# Patient Record
Sex: Female | Born: 1980 | Race: White | Hispanic: No | Marital: Married | Smoking: Former smoker
Health system: Southern US, Community
[De-identification: ages and names within clinical notes are randomized; demographics above are authoritative.]

## PROBLEM LIST (undated history)

## (undated) DIAGNOSIS — E282 Polycystic ovarian syndrome: Secondary | ICD-10-CM

## (undated) DIAGNOSIS — F419 Anxiety disorder, unspecified: Secondary | ICD-10-CM

## (undated) DIAGNOSIS — G43909 Migraine, unspecified, not intractable, without status migrainosus: Secondary | ICD-10-CM

## (undated) DIAGNOSIS — R011 Cardiac murmur, unspecified: Secondary | ICD-10-CM

## (undated) DIAGNOSIS — N200 Calculus of kidney: Secondary | ICD-10-CM

## (undated) DIAGNOSIS — E119 Type 2 diabetes mellitus without complications: Secondary | ICD-10-CM

## (undated) DIAGNOSIS — N83209 Unspecified ovarian cyst, unspecified side: Secondary | ICD-10-CM

## (undated) HISTORY — DX: Anxiety disorder, unspecified: F41.9

## (undated) HISTORY — DX: Type 2 diabetes mellitus without complications: E11.9

## (undated) HISTORY — DX: Polycystic ovarian syndrome: E28.2

## (undated) HISTORY — DX: Unspecified ovarian cyst, unspecified side: N83.209

## (undated) HISTORY — DX: Calculus of kidney: N20.0

## (undated) HISTORY — DX: Cardiac murmur, unspecified: R01.1

---

## 2001-12-25 ENCOUNTER — Other Ambulatory Visit: Admission: RE | Admit: 2001-12-25 | Discharge: 2001-12-25 | Payer: Self-pay | Admitting: Obstetrics and Gynecology

## 2003-01-18 ENCOUNTER — Other Ambulatory Visit: Admission: RE | Admit: 2003-01-18 | Discharge: 2003-01-18 | Payer: Self-pay | Admitting: *Deleted

## 2004-07-24 ENCOUNTER — Other Ambulatory Visit: Admission: RE | Admit: 2004-07-24 | Discharge: 2004-07-24 | Payer: Self-pay | Admitting: Obstetrics & Gynecology

## 2004-11-01 ENCOUNTER — Observation Stay (HOSPITAL_COMMUNITY): Admission: AD | Admit: 2004-11-01 | Discharge: 2004-11-01 | Payer: Self-pay | Admitting: Obstetrics and Gynecology

## 2004-11-01 ENCOUNTER — Encounter: Payer: Self-pay | Admitting: Emergency Medicine

## 2004-11-03 ENCOUNTER — Observation Stay (HOSPITAL_COMMUNITY): Admission: AD | Admit: 2004-11-03 | Discharge: 2004-11-04 | Payer: Self-pay | Admitting: Obstetrics & Gynecology

## 2008-01-29 ENCOUNTER — Emergency Department (HOSPITAL_COMMUNITY): Admission: EM | Admit: 2008-01-29 | Discharge: 2008-01-29 | Payer: Self-pay | Admitting: Emergency Medicine

## 2008-06-07 HISTORY — PX: ARTERY REPAIR: SHX559

## 2008-06-07 HISTORY — PX: GALLBLADDER SURGERY: SHX652

## 2008-07-17 ENCOUNTER — Inpatient Hospital Stay (HOSPITAL_COMMUNITY): Admission: AD | Admit: 2008-07-17 | Discharge: 2008-07-17 | Payer: Self-pay | Admitting: Obstetrics and Gynecology

## 2008-09-01 ENCOUNTER — Emergency Department (HOSPITAL_COMMUNITY): Admission: EM | Admit: 2008-09-01 | Discharge: 2008-09-01 | Payer: Self-pay | Admitting: *Deleted

## 2008-09-09 ENCOUNTER — Ambulatory Visit (HOSPITAL_COMMUNITY): Admission: RE | Admit: 2008-09-09 | Discharge: 2008-09-09 | Payer: Self-pay | Admitting: Legal Medicine

## 2008-09-27 ENCOUNTER — Encounter: Admission: RE | Admit: 2008-09-27 | Discharge: 2008-09-27 | Payer: Self-pay | Admitting: Gastroenterology

## 2008-11-20 ENCOUNTER — Encounter (INDEPENDENT_AMBULATORY_CARE_PROVIDER_SITE_OTHER): Payer: Self-pay | Admitting: Surgery

## 2008-11-20 ENCOUNTER — Ambulatory Visit (HOSPITAL_COMMUNITY): Admission: RE | Admit: 2008-11-20 | Discharge: 2008-11-20 | Payer: Self-pay | Admitting: Surgery

## 2010-01-25 ENCOUNTER — Emergency Department (HOSPITAL_COMMUNITY): Admission: EM | Admit: 2010-01-25 | Discharge: 2010-01-25 | Payer: Self-pay | Admitting: Emergency Medicine

## 2010-02-15 IMAGING — NM NM HEPATO W/GB/PHARM/[PERSON_NAME]
2 series · 12 of 12 positions shown · non-contrast
Comparison: None

CLINICAL DATA: Right upper quadrant pain, negative ultrasound

NUCLEAR MEDICINE HEPATOBILIARY IMAGING WITH GALLBLADDER EF
TECHNIQUE: Sequential images of the abdomen were obtained [DATE]
minutes following intravenous administration of
radiopharmaceutical.  After slow intravenous infusion of 1.3 uCg
Cholecystokinin, gallbladder ejection fraction was determined.
Radiopharmaceutical:  5.4 mCi Qc-99m Choletec

[hepato · 2.40mm/px · 6 of 30 frames shown (1 of 2)]
[frame 3/30]
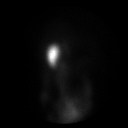
[frame 8/30]
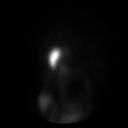
[frame 13/30]
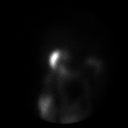
[frame 18/30]
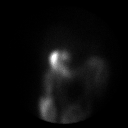
[frame 23/30]
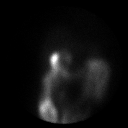
[frame 28/30]
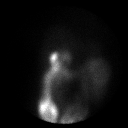

[hepato · 2.40mm/px · 6 of 60 frames shown (2 of 2)]
[frame 6/60]
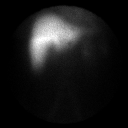
[frame 16/60]
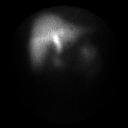
[frame 26/60]
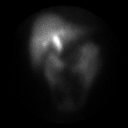
[frame 36/60]
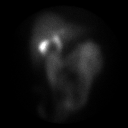
[frame 46/60]
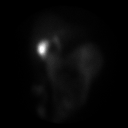
[frame 56/60]
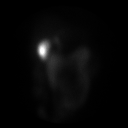

[12 of 12 positions shown; findings below may reference images not displayed]

FINDINGS: Normal tracer extraction from bloodstream, indicating normal
hepatocellular function.
Prompt excretion of tracer into biliary tree.
Small bowel visualized by 17 minutes.
Gallbladder visualized at 27 minutes.
No hepatic retention of tracer.

Following CCK administration, normal emptying of tracer from
gallbladder is visualized.
Calculated gallbladder ejection fraction is 78%, normal.
Patient experienced right upper quadrant pain with infusion of CCK.
IMPRESSION: Patent biliary tree.
Normal gallbladder ejection fraction of 78%.
Patient experienced right upper quadrant pain with CCK
administration.

## 2010-06-28 ENCOUNTER — Encounter: Payer: Self-pay | Admitting: Legal Medicine

## 2010-08-21 LAB — COMPREHENSIVE METABOLIC PANEL
ALT: 26 U/L (ref 0–35)
AST: 23 U/L (ref 0–37)
CO2: 22 mEq/L (ref 19–32)
Calcium: 9.4 mg/dL (ref 8.4–10.5)
GFR calc Af Amer: >60 mL/min (ref >60)
Sodium: 138 mEq/L (ref 135–145)
Total Protein: 7.4 g/dL (ref 6.0–8.3)

## 2010-08-21 LAB — DIFFERENTIAL
Eosinophils Absolute: 0.1 10*3/uL (ref 0.0–0.7)
Eosinophils Relative: 1 % (ref 0–5)
Lymphs Abs: 2.9 10*3/uL (ref 0.7–4.0)
Monocytes Relative: 6 % (ref 3–12)

## 2010-08-21 LAB — CBC
Hemoglobin: 14.9 g/dL (ref 12.0–15.0)
MCHC: 35.5 g/dL (ref 30.0–36.0)
RDW: 12.5 % (ref 11.5–15.5)
WBC: 10.4 10*3/uL (ref 4.0–10.5)

## 2010-08-21 LAB — URINALYSIS, ROUTINE W REFLEX MICROSCOPIC
Glucose, UA: NEGATIVE mg/dL
Ketones, ur: NEGATIVE mg/dL
Protein, ur: NEGATIVE mg/dL

## 2010-09-14 LAB — URINE MICROSCOPIC-ADD ON

## 2010-09-14 LAB — DIFFERENTIAL
Basophils Absolute: 0.1 10*3/uL (ref 0.0–0.1)
Basophils Relative: 1 % (ref 0–1)
Eosinophils Absolute: 0 10*3/uL (ref 0.0–0.7)
Monocytes Absolute: 0.4 10*3/uL (ref 0.1–1.0)
Neutro Abs: 6 10*3/uL (ref 1.7–7.7)

## 2010-09-14 LAB — COMPREHENSIVE METABOLIC PANEL
Albumin: 4.7 g/dL (ref 3.5–5.2)
Alkaline Phosphatase: 73 U/L (ref 39–117)
BUN: 7 mg/dL (ref 6–23)
Chloride: 104 mEq/L (ref 96–112)
Glucose, Bld: 93 mg/dL (ref 70–99)
Potassium: 3.9 mEq/L (ref 3.5–5.1)
Total Bilirubin: 1.8 mg/dL — ABNORMAL HIGH (ref 0.3–1.2)

## 2010-09-14 LAB — URINALYSIS, ROUTINE W REFLEX MICROSCOPIC
Bilirubin Urine: NEGATIVE
Ketones, ur: NEGATIVE mg/dL
Protein, ur: NEGATIVE mg/dL
Urobilinogen, UA: 0.2 mg/dL (ref 0.0–1.0)

## 2010-09-14 LAB — CBC
HCT: 45 % (ref 36.0–46.0)
Hemoglobin: 15.7 g/dL — ABNORMAL HIGH (ref 12.0–15.0)
Platelets: 290 10*3/uL (ref 150–400)
WBC: 8.7 10*3/uL (ref 4.0–10.5)

## 2010-09-14 LAB — GLUCOSE, CAPILLARY: Glucose-Capillary: 88 mg/dL (ref 70–99)

## 2010-09-17 LAB — URINALYSIS, ROUTINE W REFLEX MICROSCOPIC
Bilirubin Urine: NEGATIVE
Ketones, ur: NEGATIVE mg/dL
Protein, ur: NEGATIVE mg/dL
Urobilinogen, UA: 0.2 mg/dL (ref 0.0–1.0)

## 2010-09-17 LAB — CBC
HCT: 44.1 % (ref 36.0–46.0)
Hemoglobin: 15.4 g/dL — ABNORMAL HIGH (ref 12.0–15.0)
Platelets: 287 10*3/uL (ref 150–400)
RBC: 4.75 MIL/uL (ref 3.87–5.11)
WBC: 10.9 10*3/uL — ABNORMAL HIGH (ref 4.0–10.5)

## 2010-09-17 LAB — COMPREHENSIVE METABOLIC PANEL
Albumin: 4.7 g/dL (ref 3.5–5.2)
Alkaline Phosphatase: 70 U/L (ref 39–117)
BUN: 14 mg/dL (ref 6–23)
CO2: 26 mEq/L (ref 19–32)
Chloride: 102 mEq/L (ref 96–112)
Potassium: 4 mEq/L (ref 3.5–5.1)
Total Bilirubin: 1.4 mg/dL — ABNORMAL HIGH (ref 0.3–1.2)

## 2010-09-17 LAB — DIFFERENTIAL
Basophils Absolute: 0.1 10*3/uL (ref 0.0–0.1)
Basophils Relative: 1 % (ref 0–1)
Eosinophils Relative: 1 % (ref 0–5)
Monocytes Absolute: 0.6 10*3/uL (ref 0.1–1.0)
Neutro Abs: 7.7 10*3/uL (ref 1.7–7.7)

## 2010-09-17 LAB — POCT PREGNANCY, URINE: Preg Test, Ur: NEGATIVE

## 2010-09-17 LAB — URINE MICROSCOPIC-ADD ON

## 2010-09-17 LAB — POCT CARDIAC MARKERS: Troponin i, poc: <0.05 ng/mL (ref 0.00–0.09)

## 2010-10-20 NOTE — Op Note (Signed)
Amber, Wise                   ACCOUNT NO.:  [id number]   MEDICAL RECORD NO.:  [id number]          PATIENT TYPE:  AMB   LOCATION:  SDS                          FACILITY:  [facility]   PHYSICIAN:  Currie Paris, M.D.DATE OF BIRTH:  02-Jun-1981   DATE OF PROCEDURE:  11/20/2008  DATE OF DISCHARGE:  11/20/2008                               OPERATIVE REPORT   PREOPERATIVE DIAGNOSIS:  Biliary dyskinesia.   POSTOPERATIVE DIAGNOSIS:  Biliary dyskinesia.   OPERATION:  Laparoscopic cholecystectomy with operative cholangiogram.   SURGEON:  Currie Paris, MD   ANESTHESIA:  General.   CLINICAL HISTORY:  This is a 30 year old woman who has been having  intermittent biliary type symptoms with a negative workup with the  exception of production of her symptoms on administration of CCK for a  HIDA scan.  After discussion with the patient, she elected to proceed to  cholecystectomy.   DESCRIPTION OF PROCEDURE:  I saw the patient in the holding area.  She  had no further questions.  We confirmed the procedure as noted above.   The patient was taken to the operating room and after satisfactory  general endotracheal anesthesia had been obtained, the abdomen was  prepped and draped.  The time-out was done.   I used 0.25% plain Marcaine for each incision.  A local incision was  made in the inferior margin of the umbilicus.  The fascia identified and  opened and the peritoneal cavity entered.  Hasson was placed and the  abdomen was insufflated to 15.   The camera showed no gross abnormalities in the abdominal cavity.  The  patient was then placed in reverse Trendelenburg and tilted left.  A 10-  11 trocar was placed in the epigastrium and two 5s laterally.  The  gallbladder looked normal.  The peritoneum over the area of cystic duct  was opened and I made a nice window and could see the posterior and  anterior branch of the cystic artery as well as a nice segment of cystic  duct.  I put  clips on the cystic artery and then one at the cystic duct  at the junction with the gallbladder.   The cystic duct was opened and a Cook catheter threaded.  The cystic  duct was extremely tiny but we were able to get a taut catheter in.   Operative cholangiogram showed prompt filling of the duodenum with no  delay.  We had to inject a little bit extra to get the hepatic radicals  to fill as it went antegrade so well but I thought the anatomy was  normal.   The catheter was removed and two clips were placed on the stay side of  the cystic duct and it was divided.  Additional clips were placed on the  artery to be sure we had two clips on the stay side and both branches  were divided.   The gallbladder was removed from below to above with coagulation current  of the cautery.  It was then brought out of the umbilical site.   I  reinsufflated to make sure there was no evidence of bleeding or bile  leaks and everything appeared okay.  The lateral trocars were removed  under direct vision.  The umbilical site was closed with a purse-string.  The abdomen was deflated through the epigastric port which was closed.  All skin incisions were closed with 4-0 Monocryl subcuticular and  Dermabond.   The patient tolerated the procedure well and there were no  complications.      Currie Paris, M.D.  Electronically Signed     CJS/MEDQ  D:  11/20/2008  T:  11/21/2008  Job:  [id number]   cc:   Bernette Redbird, M.D.

## 2010-10-23 NOTE — Discharge Summary (Signed)
[name], Amber Wise                   ACCOUNT NO.:  [id number]   MEDICAL RECORD NO.:  [id number]          PATIENT TYPE:  INP   LOCATION:  9307                          FACILITY:  [facility]   PHYSICIAN:  Freddy Finner, M.D.   DATE OF BIRTH:  1981/04/02   DATE OF ADMISSION:  11/03/2004  DATE OF DISCHARGE:                                 DISCHARGE SUMMARY   DISCHARGE DIAGNOSES:  1.  Ovarian cyst with rupture.  2.  Hemoperitoneum.  3.  Fever, probably secondary to hemoperitoneum.   OPERATIVE PROCEDURES:  None.   DISPOSITION:  The patient is in satisfactory, improved condition on the  morning after her admission. She is afebrile now. She is discharged home  with light activity, regular diet, Augmentin 875 mg b.i.d. for 5 days,  follow up in the office in 2 days. She is to report recurrent fever above  100 or severe abdominal pain.   Details of the present illness and physical exam are recorded in the  admission note. Briefly, the patient had an ovarian cyst with hemorrhage  into the peritoneal cavity on Oct 31, 2004. She was evaluated at [facility].  Subsequently she was transported to this hospital where she was observed for  24 hours. Following discharge, she had temperature spike to 101 and was  readmitted yesterday for that reason.   Laboratory data during this admission includes a normal urinalysis.  Admission CBC with hemoglobin of 10, WBC of 8.6. Follow-up hemoglobin was  10.3 on the morning after admission, white count of 5.1.   HOSPITAL COURSE:  The patient was admitted for further management due to the  temperature elevation. She was started on Unasyn 3 g IV q.6h. She was  followed through the night. By the morning after her admission, her  condition was satisfactory. She was discharged home with disposition as  noted above.      WRN/MEDQ  D:  11/04/2004  T:  11/04/2004  Job:  [id number]

## 2010-10-23 NOTE — H&P (Signed)
Amber Wise, Amber Wise                   ACCOUNT NO.:  [id number]   MEDICAL RECORD NO.:  [id number]          PATIENT TYPE:  INP   LOCATION:  9307                          FACILITY:  [facility]   PHYSICIAN:  Freddy Finner, M.D.   DATE OF BIRTH:  [birth year]   DATE OF ADMISSION:  11/03/2004  DATE OF DISCHARGE:                                HISTORY & PHYSICAL   ADMITTING DIAGNOSIS:  Ruptured hemorrhage ovarian cyst.  Previously-  documented hemoperitoneum, now with onset of fever.   HISTORY OF PRESENT ILLNESS:  The patient is a 30 year old who first  presented to the [facility] emergency room on the evening of Oct 31, 2004, 3 days  prior to admission, following the onset of abdominal pain following an  episode of intercourse.  CT scan of the abdomen and pelvis at that hospital  revealed fluid within the peritoneal cavity consistent with blood, presumed  from a ruptured ovarian cyst.  She was transferred to this hospital and  observed for 23 hours by Dr. Vincente Poli.  Clinically, she was stable over that  interval of time and in adequate condition for discharge.  She was  discharged home on Nov 01, 2004.  She presents today with a feeling of  malaise on the morning of presentation and progressive increasing  temperature to the level of 101 on presentation here to the emergency room.  Because of the history and fever, she is now admitted.   CURRENT REVIEW OF SYSTEMS:  Otherwise negative.  She had an adequate  appetite.  She denies nausea, vomiting, diarrhea, urinary tract symptoms.   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  She also has a history of  nephrolithiasis.  There are no other known significant medical illnesses.   ALLERGIES:  There are no known allergies to medications.   She has never had a blood transfusion.   MEDICATIONS:  Her only medication at the present time on admission was  Percocet, which she was taking for  abdominal pain.   PHYSICAL EXAMINATION:  VITAL SIGNS:  Temperature was 101, documented in the  triage area on admission.  NECK:  Thyroid gland is not palpably enlarged.  CHEST:  Clear to auscultation throughout.  HEART:  Sinus tachycardia with grade 2/6 to 3/6 systolic murmur heard at the  left second intercostal space.  No other audible murmurs, rubs, or gallops  were noted.  ABDOMEN:  Soft.  There is mild generalized tenderness.  There is no CVA  tenderness.  PELVIC:  Not performed tonight.  EXTREMITIES:  Without clubbing, cyanosis, or edema.   LABORATORY DATA:  WBC of 8.6 on admission, hemoglobin of 10.   ASSESSMENT:  Ruptured ovarian cyst with hemoperitoneum documented previously  on Oct 31, 2004 and Nov 01, 2004, now with onset of fever, possibly  secondary to hemoperitoneum versus some type of infection complication  associated with this.   PLAN:  The patient is admitted now for IV fluid hydration, for IV  antibiotics, and for close observation.  WRN/MEDQ  D:  11/03/2004  T:  11/03/2004  Job:  [id number]

## 2010-12-05 ENCOUNTER — Emergency Department (HOSPITAL_COMMUNITY)
Admission: EM | Admit: 2010-12-05 | Discharge: 2010-12-05 | Disposition: A | Discharge status: Home / Self Care | Payer: BC Managed Care – PPO | Attending: Emergency Medicine | Admitting: Emergency Medicine

## 2010-12-05 ENCOUNTER — Emergency Department (HOSPITAL_COMMUNITY): Payer: BC Managed Care – PPO

## 2010-12-05 DIAGNOSIS — R109 Unspecified abdominal pain: Secondary | ICD-10-CM | POA: Insufficient documentation

## 2010-12-05 DIAGNOSIS — Z9089 Acquired absence of other organs: Secondary | ICD-10-CM | POA: Insufficient documentation

## 2010-12-05 LAB — URINE MICROSCOPIC-ADD ON

## 2010-12-05 LAB — POCT I-STAT, CHEM 8
Creatinine, Ser: 0.8 mg/dL (ref 0.50–1.10)
Glucose, Bld: 100 mg/dL — ABNORMAL HIGH (ref 70–99)
Hemoglobin: 15 g/dL (ref 12.0–15.0)
TCO2: 23 mmol/L (ref 0–100)

## 2010-12-05 LAB — URINALYSIS, ROUTINE W REFLEX MICROSCOPIC
Nitrite: NEGATIVE
Specific Gravity, Urine: 1.022 (ref 1.005–1.030)
Urobilinogen, UA: 0.2 mg/dL (ref 0.0–1.0)

## 2010-12-05 LAB — PREGNANCY, URINE: Preg Test, Ur: NEGATIVE

## 2010-12-23 ENCOUNTER — Inpatient Hospital Stay (INDEPENDENT_AMBULATORY_CARE_PROVIDER_SITE_OTHER)
Admission: RE | Admit: 2010-12-23 | Discharge: 2010-12-23 | Disposition: A | Discharge status: Home / Self Care | Payer: BC Managed Care – PPO | Source: Ambulatory Visit | Attending: Emergency Medicine | Admitting: Emergency Medicine

## 2010-12-23 DIAGNOSIS — IMO0001 Reserved for inherently not codable concepts without codable children: Secondary | ICD-10-CM

## 2011-06-14 ENCOUNTER — Ambulatory Visit (INDEPENDENT_AMBULATORY_CARE_PROVIDER_SITE_OTHER): Payer: BC Managed Care – PPO

## 2011-06-14 DIAGNOSIS — R05 Cough: Secondary | ICD-10-CM

## 2011-06-14 DIAGNOSIS — R071 Chest pain on breathing: Secondary | ICD-10-CM

## 2011-06-14 DIAGNOSIS — R059 Cough, unspecified: Secondary | ICD-10-CM

## 2012-12-04 ENCOUNTER — Emergency Department (HOSPITAL_COMMUNITY)
Admission: EM | Admit: 2012-12-04 | Discharge: 2012-12-04 | Disposition: A | Discharge status: Home / Self Care | Payer: BC Managed Care – PPO | Attending: Emergency Medicine | Admitting: Emergency Medicine

## 2012-12-04 ENCOUNTER — Emergency Department (HOSPITAL_COMMUNITY): Payer: BC Managed Care – PPO

## 2012-12-04 DIAGNOSIS — R29810 Facial weakness: Secondary | ICD-10-CM | POA: Insufficient documentation

## 2012-12-04 DIAGNOSIS — Z79899 Other long term (current) drug therapy: Secondary | ICD-10-CM | POA: Insufficient documentation

## 2012-12-04 DIAGNOSIS — R739 Hyperglycemia, unspecified: Secondary | ICD-10-CM

## 2012-12-04 DIAGNOSIS — R000 Tachycardia, unspecified: Secondary | ICD-10-CM | POA: Insufficient documentation

## 2012-12-04 DIAGNOSIS — R7309 Other abnormal glucose: Secondary | ICD-10-CM | POA: Insufficient documentation

## 2012-12-04 DIAGNOSIS — Z3202 Encounter for pregnancy test, result negative: Secondary | ICD-10-CM | POA: Insufficient documentation

## 2012-12-04 DIAGNOSIS — G43809 Other migraine, not intractable, without status migrainosus: Secondary | ICD-10-CM

## 2012-12-04 DIAGNOSIS — Z881 Allergy status to other antibiotic agents status: Secondary | ICD-10-CM | POA: Insufficient documentation

## 2012-12-04 DIAGNOSIS — R471 Dysarthria and anarthria: Secondary | ICD-10-CM | POA: Insufficient documentation

## 2012-12-04 DIAGNOSIS — Z7982 Long term (current) use of aspirin: Secondary | ICD-10-CM | POA: Insufficient documentation

## 2012-12-04 DIAGNOSIS — R209 Unspecified disturbances of skin sensation: Secondary | ICD-10-CM | POA: Insufficient documentation

## 2012-12-04 DIAGNOSIS — F419 Anxiety disorder, unspecified: Secondary | ICD-10-CM

## 2012-12-04 DIAGNOSIS — R2990 Unspecified symptoms and signs involving the nervous system: Secondary | ICD-10-CM

## 2012-12-04 DIAGNOSIS — Z888 Allergy status to other drugs, medicaments and biological substances status: Secondary | ICD-10-CM | POA: Insufficient documentation

## 2012-12-04 DIAGNOSIS — R29818 Other symptoms and signs involving the nervous system: Secondary | ICD-10-CM

## 2012-12-04 DIAGNOSIS — M6281 Muscle weakness (generalized): Secondary | ICD-10-CM | POA: Insufficient documentation

## 2012-12-04 DIAGNOSIS — F411 Generalized anxiety disorder: Secondary | ICD-10-CM | POA: Insufficient documentation

## 2012-12-04 DIAGNOSIS — Z8679 Personal history of other diseases of the circulatory system: Secondary | ICD-10-CM | POA: Insufficient documentation

## 2012-12-04 LAB — URINALYSIS, ROUTINE W REFLEX MICROSCOPIC
Leukocytes, UA: NEGATIVE
Nitrite: NEGATIVE
Specific Gravity, Urine: 1.027 (ref 1.005–1.030)
Urobilinogen, UA: 0.2 mg/dL (ref 0.0–1.0)
pH: 5 (ref 5.0–8.0)

## 2012-12-04 LAB — PROTIME-INR: Prothrombin Time: 12.5 seconds (ref 11.6–15.2)

## 2012-12-04 LAB — POCT I-STAT, CHEM 8
BUN: 7 mg/dL (ref 6–23)
Calcium, Ion: 1.13 mmol/L (ref 1.12–1.23)
Chloride: 103 mEq/L (ref 96–112)
Creatinine, Ser: 0.9 mg/dL (ref 0.50–1.10)
Glucose, Bld: 113 mg/dL — ABNORMAL HIGH (ref 70–99)
HCT: 43 % (ref 36.0–46.0)
Potassium: 3.3 mEq/L — ABNORMAL LOW (ref 3.5–5.1)

## 2012-12-04 LAB — CBC
HCT: 41.4 % (ref 36.0–46.0)
MCV: 88.5 fL (ref 78.0–100.0)
Platelets: 301 10*3/uL (ref 150–400)
RBC: 4.68 MIL/uL (ref 3.87–5.11)
WBC: 9.9 10*3/uL (ref 4.0–10.5)

## 2012-12-04 LAB — DIFFERENTIAL
Eosinophils Relative: 0 % (ref 0–5)
Lymphocytes Relative: 36 % (ref 12–46)
Lymphs Abs: 3.5 10*3/uL (ref 0.7–4.0)

## 2012-12-04 LAB — URINE MICROSCOPIC-ADD ON

## 2012-12-04 LAB — POCT PREGNANCY, URINE: Preg Test, Ur: NEGATIVE

## 2012-12-04 LAB — COMPREHENSIVE METABOLIC PANEL
ALT: 24 U/L (ref 0–35)
Alkaline Phosphatase: 79 U/L (ref 39–117)
CO2: 23 mEq/L (ref 19–32)
Calcium: 9.2 mg/dL (ref 8.4–10.5)
Chloride: 100 mEq/L (ref 96–112)
GFR calc Af Amer: >90 mL/min (ref >90)
GFR calc non Af Amer: 90 mL/min — ABNORMAL LOW (ref >90)
Glucose, Bld: 115 mg/dL — ABNORMAL HIGH (ref 70–99)
Sodium: 139 mEq/L (ref 135–145)
Total Bilirubin: 1.2 mg/dL (ref 0.3–1.2)

## 2012-12-04 LAB — RAPID URINE DRUG SCREEN, HOSP PERFORMED
Barbiturates: NOT DETECTED
Cocaine: NOT DETECTED

## 2012-12-04 LAB — GLUCOSE, CAPILLARY

## 2012-12-04 MED ORDER — LORAZEPAM 2 MG/ML IJ SOLN
1.0000 mg | Freq: Once | INTRAMUSCULAR | Status: DC
  Filled 2012-12-04: qty 1

## 2012-12-04 MED ORDER — LORAZEPAM 2 MG/ML IJ SOLN
1.0000 mg | Freq: Once | INTRAMUSCULAR | Status: AC
  Administered 2012-12-04: 1 mg via INTRAVENOUS

## 2012-12-04 MED ORDER — KETOROLAC TROMETHAMINE 30 MG/ML IJ SOLN
30.0000 mg | Freq: Once | INTRAMUSCULAR | Status: AC
  Administered 2012-12-04: 30 mg via INTRAVENOUS
  Filled 2012-12-04: qty 1

## 2012-12-04 MED ORDER — IBUPROFEN 600 MG PO TABS: 600.0000 mg | ORAL_TABLET | Freq: Four times a day (QID) | ORAL | Status: DC | PRN

## 2012-12-04 MED ORDER — LORAZEPAM 1 MG PO TABS: 0.5000 mg | ORAL_TABLET | Freq: Three times a day (TID) | ORAL | Status: DC | PRN

## 2012-12-04 NOTE — ED Notes (Signed)
To MRI via stretcher  

## 2012-12-04 NOTE — ED Notes (Signed)
States has a frontal headache starting -- had a headache a few days ago and took Execedrin Headache

## 2012-12-04 NOTE — ED Provider Notes (Signed)
Patient requesting pain medicine to get her through to her followup appointment. Patient given prescription for Ativan by Junius Finner. Will prescribed 600mg  ibuprofen every 6 hours for headache.  Antony Madura, PA-C 12/04/12 [phone]

## 2012-12-04 NOTE — Code Documentation (Signed)
32 yo female who was at work at an [employer] office when she had sudden onset of difficulty speaking and typing. This progressed to numbness of her R face and RUE.  A co-worker drove her to the ED, arriving at 1132.  Code stroke was activated in triage at 1138.  CT was unremarkable. She is extremely anxious, admits to being under much stress recently r/t her financial problems (husband out of work d/t recent back surgery for an ependymoma).  She does have h/o of headaches, has on occasion vomited with ha.  NIHSS=0; Dr. Roseanne Reno here and suspects probable migraine. MRI is pending. Pt reassured, and aware of and agreeable with treatment plan.  Handoff done with ED RN.

## 2012-12-04 NOTE — Consult Note (Signed)
Referring Physician: Rhunette Croft    Chief Complaint: transient right arm and face tingling with difficulty with speech  HPI:                                                                                                                                         Amber Wise is an 32 y.o. female who presented to work today feeling her normal self.  While working she noted she was having difficulty getting her thoughts out and typing.  Over a 20 minute period of time this evolved into right arm tingling and then right face tingling.  She then became very anxious about her symptoms. Code stroke was called.  CT head was normal and by the time she was seen in ED her symptoms had resolved. She remains very anxious but has no further complaints. She does endorse HA's from time to time--"usually when pollen is high" and will have nausea with some HA.  She is not treated for HA and takes no medications for HA. She denies any history of migraine. She does endorse being in a lot of stress due to financial difficulties at home.   Date last known well: 6.30.14 Time last known well: 11:10 tPA Given: No: resolution of symptoms NIHSS --0  No past medical history on file.  No past surgical history on file.  family history: M--none F--CHF  Social History:  has no tobacco, alcohol, and drug history on file.  Allergies: Allergies not on file  Medications:                                                                                                                           Current Facility-Administered Medications  Medication Dose Route Frequency Provider Last Rate Last Dose  . LORazepam (ATIVAN) injection 1 mg  1 mg Intravenous Once Noel Christmas      . LORazepam (ATIVAN) injection 1 mg  1 mg Intravenous Once Noel Christmas       No current outpatient prescriptions on file.     ROS:  History obtained from the patient  General ROS: negative for - chills, fatigue, fever, night sweats, weight gain or weight loss Psychological ROS: negative for - behavioral disorder, hallucinations, memory difficulties, mood swings or suicidal ideation Ophthalmic ROS: negative for - blurry vision, double vision, eye pain or loss of vision ENT ROS: negative for - epistaxis, nasal discharge, oral lesions, sore throat, tinnitus or vertigo Allergy and Immunology ROS: negative for - hives or itchy/watery eyes Hematological and Lymphatic ROS: negative for - bleeding problems, bruising or swollen lymph nodes Endocrine ROS: negative for - galactorrhea, hair pattern changes, polydipsia/polyuria or temperature intolerance Respiratory ROS: negative for - cough, hemoptysis, shortness of breath or wheezing Cardiovascular ROS: negative for - chest pain, dyspnea on exertion, edema or irregular heartbeat Gastrointestinal ROS: negative for - abdominal pain, diarrhea, hematemesis, nausea/vomiting or stool incontinence Genito-Urinary ROS: negative for - dysuria, hematuria, incontinence or urinary frequency/urgency Musculoskeletal ROS: negative for - joint swelling or muscular weakness Neurological ROS: as noted in HPI Dermatological ROS: negative for rash and skin lesion changes  Neurologic Examination:                                                                                                      Blood pressure 134/89, pulse 113, temperature 98.5 F (36.9 C), temperature source Oral, resp. rate 18, last menstrual period 11/15/2012, SpO2 100.00%.   Mental Status: Alert -very anxious, oriented, thought content appropriate.  Speech fluent without evidence of aphasia.  Able to follow 3 step commands without difficulty. Cranial Nerves: II: Discs flat bilaterally; Visual fields grossly normal, pupils equal, round, reactive to light and accommodation III,IV, VI: ptosis not present, extra-ocular  motions intact bilaterally V,VII: smile symmetric, facial light touch sensation normal bilaterally VIII: hearing normal bilaterally IX,X: gag reflex present XI: bilateral shoulder shrug XII: midline tongue extension Motor: Right : Upper extremity   5/5    Left:     Upper extremity   5/5  Lower extremity   5/5     Lower extremity   5/5 Tone and bulk:normal tone throughout; no atrophy noted Sensory: Pinprick and light touch intact throughout, bilaterally Deep Tendon Reflexes:  Right: Upper Extremity   Left: Upper extremity   biceps (C-5 to C-6) 2/4   biceps (C-5 to C-6) 2/4 tricep (C7) 2/4    triceps (C7) 2/4 Brachioradialis (C6) 2/4  Brachioradialis (C6) 2/4  Lower Extremity Lower Extremity  quadriceps (L-2 to L-4) 2/4   quadriceps (L-2 to L-4) 2/4 Achilles (S1) 2/4   Achilles (S1) 2/4  Plantars: Right: downgoing   Left: downgoing Cerebellar: normal finger-to-nose,  normal heel-to-shin test Gait: not tested CV: pulses palpable throughout    Results for orders placed during the hospital encounter of 12/04/12 (from the past 48 hour(s))  GLUCOSE, CAPILLARY     Status: Abnormal   Collection Time    12/04/12 11:51 AM      Result Value Range   Glucose-Capillary 125 (*) 70 - 99 mg/dL   Comment 1 Documented in Chart     Comment 2 Notify RN    CBC  Status: None   Collection Time    12/04/12 11:55 AM      Result Value Range   WBC 9.9  4.0 - 10.5 K/uL   RBC 4.68  3.87 - 5.11 MIL/uL   Hemoglobin 14.4  12.0 - 15.0 g/dL   HCT 30.8  65.7 - 84.6 %   MCV 88.5  78.0 - 100.0 fL   MCH 30.8  26.0 - 34.0 pg   MCHC 34.8  30.0 - 36.0 g/dL   RDW 96.2  95.2 - 84.1 %   Platelets 301  150 - 400 K/uL  DIFFERENTIAL     Status: None   Collection Time    12/04/12 11:55 AM      Result Value Range   Neutrophils Relative % 59  43 - 77 %   Neutro Abs 5.8  1.7 - 7.7 K/uL   Lymphocytes Relative 36  12 - 46 %   Lymphs Abs 3.5  0.7 - 4.0 K/uL   Monocytes Relative 5  3 - 12 %   Monocytes  Absolute 0.5  0.1 - 1.0 K/uL   Eosinophils Relative 0  0 - 5 %   Eosinophils Absolute 0.0  0.0 - 0.7 K/uL   Basophils Relative 0  0 - 1 %   Basophils Absolute 0.0  0.0 - 0.1 K/uL   Ct Head Wo Contrast  12/04/2012   *RADIOLOGY REPORT*  Clinical Data:  Code stroke, right side numbness, not making sense  CT HEAD WITHOUT CONTRAST  Technique:  Contiguous axial images were obtained from the base of the skull through the vertex without contrast.  Comparison: None  Findings: Normal ventricular morphology. No midline shift or mass effect. Normal appearance of brain parenchyma. No intracranial hemorrhage, mass lesion or evidence of acute infarction. No extra-axial fluid collections. Bones and sinuses unremarkable.  IMPRESSION: No acute intracranial abnormalities.  Findings called to Dr. Roseanne Reno on 12/04/2012 at 1152 hrs.   Original Report Authenticated By: Ulyses Southward, M.D.    Assessment and plan discussed with with attending physician and they are in agreement.    Felicie Morn PA-C [facility] [phone]  12/04/2012, 12:10 PM   Assessment: 32 y.o. female with transient progressive of symptoms including word finding difficulty leading to right hand and forearm tingling then right face tingling. Likely migraine variant given history of migraine HA's in past and slow progression of deficits over 20 minute period.  Stroke is unlikely but must rule out.  Stroke Risk Factors - none  Plan:  1. MRI brain to rule out CVA-if negative no further work up needed.  If positive continue with CVA work up. 2. Consider anxiolytic as patient is under significant stress.  3. Aspirin 81 mg per day  Felicie Morn PA-C [facility]  I personally participated in this patient's evaluation and management including neurological examination, as well as formulating the above clinical impression and management recommendations.  Venetia Maxon M.D. [facility] [phone]

## 2012-12-04 NOTE — ED Provider Notes (Signed)
History    CSN: [id number] Arrival date & time 12/04/12  1127  First MD Initiated Contact with Patient 12/04/12 1139     Chief Complaint  Patient presents with  . 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  Pt anxious on arrival to ED c/o right UE weakness and dysarthria.  Pt is A&O w/o facial droop. Facial weakness and dysarthria gradually resolving. Pt reports being under a lot of stress related to family finances and husband's health, who recently underwent back surgery and is unable to work at this time. Denies any head pain or dizziness. Denies any numbness or weakness of LEs. Pt states she is relatively healthy, does not have any previous hx of stroke like symptoms, gets headaches with nausea 3-4x/year (usually due to pollen) but no dx of migraines, has never been on BP medication.  Denies cardiopulmonary hx.  Denies chest pain, SOB, syncope or head trauma.    No past medical history on file. No past surgical history on file. No family history on file. History  Substance Use Topics  . Smoking status: Not on file  . Smokeless tobacco: Not on file  . Alcohol Use: Not on file   OB History   No data available     Review of Systems  Constitutional: Negative for fever, chills and fatigue.  Respiratory: Negative for shortness of breath.   Cardiovascular: Negative for chest pain.  Neurological: Positive for speech difficulty, weakness and numbness. Negative for dizziness, tremors, seizures, syncope, facial asymmetry, light-headedness and headaches.  All other systems reviewed and are negative.    Allergies  Amoxicillin-pot clavulanate; Doxycycline; Hydrocodone; and Prednisone  Home Medications   Current Outpatient Rx  Name  Route  Sig  Dispense  Refill  .  aspirin 81 MG tablet   Oral   Take 81 mg by mouth as needed for pain (chest pain).         . calcium carbonate (TUMS - DOSED IN MG ELEMENTAL CALCIUM) 500 MG chewable tablet   Oral   Chew 1 tablet by mouth as needed for heartburn.         . Prenatal Vit-Fe Fumarate-FA (PRENATAL MULTIVITAMIN) TABS   Oral   Take 1 tablet by mouth daily at 12 noon.         . ranitidine (ZANTAC) 150 MG capsule   Oral   Take 150 mg by mouth as needed for heartburn.         Marland Kitchen ibuprofen (ADVIL,MOTRIN) 600 MG tablet   Oral   Take 1 tablet (600 mg total) by mouth every 6 (six) hours as needed for pain.   30 tablet   0   . LORazepam (ATIVAN) 1 MG tablet   Oral   Take 0.5 tablets (0.5 mg total) by mouth 3 (three) times daily as needed for anxiety.   6 tablet   0    BP 134/89  Pulse 95  Temp(Src) 97.8 F (36.6 C) (Oral)  Resp 21  SpO2 99%  LMP 11/15/2012 Physical Exam  Nursing note and vitals reviewed. Constitutional: She is oriented to person, place, and time. She appears well-developed and well-nourished.  Pt lying in exam bed, trembling, very anxious.  Dr. Roseanne Reno speaking with pt and performing physical exam.  HENT:  Head: Normocephalic and atraumatic.  Mouth/Throat: Oropharynx is clear and moist.  No oropharyngeal exudate.  Eyes: Conjunctivae and EOM are normal. Pupils are equal, round, and reactive to light. Right eye exhibits no discharge. Left eye exhibits no discharge. No scleral icterus.  Neck: Normal range of motion. Neck supple.  No nuchal rigidity or meningeal signs.    Cardiovascular: Regular rhythm and normal heart sounds.  Tachycardia present.   Pulmonary/Chest: Effort normal and breath sounds normal. No respiratory distress. She has no wheezes. She has no rales. She exhibits no tenderness.  Abdominal: Soft. She exhibits no distension. There is no tenderness.  Musculoskeletal: Normal range of motion.  Neurological: She is alert and oriented to person, place, and time. She  displays normal reflexes. No cranial nerve deficit. She exhibits normal muscle tone. Coordination normal.  CN II-XII in tact, no focal deficit, nl finger to nose coordination. Nl sensation, 5/5 strength in all major muscle groups.   Skin: Skin is warm and dry. She is not diaphoretic.  Psychiatric: Her mood appears anxious.    ED Course  Procedures (including critical care time) Labs Reviewed  COMPREHENSIVE METABOLIC PANEL - Abnormal; Notable for the following:    Potassium 3.4 (*)    Glucose, Bld 115 (*)    GFR calc non Af Amer 90 (*)    All other components within normal limits  URINALYSIS, ROUTINE W REFLEX MICROSCOPIC - Abnormal; Notable for the following:    Color, Urine AMBER (*)    APPearance CLOUDY (*)    Hgb urine dipstick TRACE (*)    All other components within normal limits  GLUCOSE, CAPILLARY - Abnormal; Notable for the following:    Glucose-Capillary 125 (*)    All other components within normal limits  URINE MICROSCOPIC-ADD ON - Abnormal; Notable for the following:    Squamous Epithelial / LPF MANY (*)    Bacteria, UA FEW (*)    All other components within normal limits  POCT I-STAT, CHEM 8 - Abnormal; Notable for the following:    Potassium 3.3 (*)    Glucose, Bld 113 (*)    All other components within normal limits  ETHANOL  PROTIME-INR  APTT  CBC  DIFFERENTIAL  TROPONIN I  URINE RAPID DRUG SCREEN (HOSP PERFORMED)  POCT I-STAT TROPONIN I  POCT PREGNANCY, URINE   Ct Head Wo Contrast  12/04/2012   *RADIOLOGY REPORT*  Clinical Data:  Code stroke, right side numbness, not making sense  CT HEAD WITHOUT CONTRAST  Technique:  Contiguous axial images were obtained from the base of the skull through the vertex without contrast.  Comparison: None  Findings: Normal ventricular morphology. No midline shift or mass effect. Normal appearance of brain parenchyma. No intracranial hemorrhage, mass lesion or evidence of acute infarction. No extra-axial fluid collections. Bones  and sinuses unremarkable.  IMPRESSION: No acute intracranial abnormalities.  Findings called to Dr. Roseanne Reno on 12/04/2012 at 1152 hrs.   Original Report Authenticated By: Ulyses Southward, M.D.   Mr Brain Wo Contrast  12/04/2012   *RADIOLOGY REPORT*  Clinical Data: Slurred speech.  Code stroke.  MRI HEAD WITHOUT CONTRAST  Technique:  Multiplanar, multiecho pulse sequences of the brain and surrounding structures were obtained according to standard protocol without intravenous contrast.  Comparison: CT 12/04/2012  Findings: Negative for acute infarct.  Negative for chronic ischemia.  Cerebral white matter is normal. Negative for demyelinating disease.  Brainstem and cerebellum are normal.  Negative for intracranial hemorrhage.  Negative for mass or edema. Ventricle size is normal.  IMPRESSION: Normal MRI of the brain.  Original Report Authenticated By: Janeece Riggers, M.D.   1. Stroke-like symptoms   2. Anxiety   3. Hyperglycemia   4. Migraine variant     MDM  Dr. Roseanne Reno, neurology, was present in room performing H&P when I entered the room. He believes pt suffered an atypical migraine, however will still get MRI to r/o stroke.   Pt given ativan in ED. Urine preg: neg Urine drug: neg UA: unremarkable Chem 8: mild hypokalemia istat troponin: neg INR: nl APTT: nl CBC: nl CMP: unremarkable CBG: mild hyperglycermia, 125 (pt has been told she is borderline diabetic)   CT: no acute intracranial abnormalities MRI: normal  Will discharge home with neurology referral and f/u with PCP for control of anxiety and stress.  Discussed findings with pt and husband and will be referring her to [facility] for f/u. Pt provided CD copy of MRI and CT to bring to neurology.  Advised pt to call to make an appointment.  Pt is to also f/u with Dr. Marina Goodell, PCP to better address and manage patient's stress.  Rx: ativan 0.5 (6tabs) Pt verbalized understanding and agreement with tx plan. Vitals:  unremarkable. Discharged in stable condition.    Discussed pt with attending during ED encounter.    Junius Finner, PA-C 12/05/12 [phone]

## 2012-12-04 NOTE — ED Notes (Signed)
Coworker brought pt for sudden onset R sided facial and arm numbness and weakness at 1110 today. On arrival to ED, pt upset with R sided weakness and dysarthria. Pt is A&O, no facial droop.

## 2012-12-04 NOTE — ED Notes (Signed)
CBG 125 °

## 2012-12-05 ENCOUNTER — Telehealth: Payer: Self-pay | Admitting: Neurology

## 2012-12-05 NOTE — ED Provider Notes (Signed)
Medical screening examination/treatment/procedure(s) were performed by non-physician practitioner and as supervising physician I was immediately available for consultation/collaboration.  Sharnetta Gielow, MD 12/05/12 1533 

## 2012-12-06 NOTE — ED Provider Notes (Signed)
Medical screening examination/treatment/procedure(s) were conducted as a shared visit with non-physician practitioner(s) and myself.  I personally evaluated the patient during the encounter  Derwood Kaplan, MD 12/06/12 [phone]

## 2012-12-14 ENCOUNTER — Other Ambulatory Visit: Payer: Self-pay | Admitting: Neurology

## 2012-12-14 ENCOUNTER — Ambulatory Visit (INDEPENDENT_AMBULATORY_CARE_PROVIDER_SITE_OTHER): Payer: BC Managed Care – PPO | Admitting: Neurology

## 2012-12-14 ENCOUNTER — Encounter: Payer: Self-pay | Admitting: Neurology

## 2012-12-14 ENCOUNTER — Telehealth: Payer: Self-pay | Admitting: Neurology

## 2012-12-14 VITALS — BP 128/89 | HR 109 | Ht 62.0 in | Wt 163.0 lb

## 2012-12-14 DIAGNOSIS — F411 Generalized anxiety disorder: Secondary | ICD-10-CM | POA: Insufficient documentation

## 2012-12-14 DIAGNOSIS — G43809 Other migraine, not intractable, without status migrainosus: Secondary | ICD-10-CM

## 2012-12-14 MED ORDER — NORTRIPTYLINE HCL 10 MG PO CAPS: 20.0000 mg | ORAL_CAPSULE | Freq: Every day | ORAL | Status: DC

## 2012-12-14 MED ORDER — SUMATRIPTAN SUCCINATE 100 MG PO TABS: 100.0000 mg | ORAL_TABLET | ORAL | Status: DC | PRN

## 2012-12-14 MED ORDER — LORAZEPAM 1 MG PO TABS: 0.5000 mg | ORAL_TABLET | Freq: Three times a day (TID) | ORAL | Status: AC | PRN

## 2012-12-14 NOTE — Addendum Note (Signed)
Addended by: Levert Feinstein on: 12/14/2012 04:36 PM   Modules accepted: Orders

## 2012-12-14 NOTE — Telephone Encounter (Signed)
Please let patient know I have ordered ativan 0.5mg  bid prn 60 tabs with one refill for her, when she come back for rx, she can have blood work done at the same time

## 2012-12-14 NOTE — Progress Notes (Signed)
[facility]  PATIENT: Amber Wise DOB: Apr 18, 1981  HISTORICAL  Seleena is 32 years old right-handed Caucasian female, accompanied by her husband, referred by her primary care physician Dr. Marina Goodell, in the emergency room for evaluation of headaches,.  She recently on the through a lot of stress, her husband had a spinal cord surgery, she is worried about her finances, she also has a very stressful job, she began to have frequent headaches, in June 30th 2014, while at work, she developed right-sided numbness, difficulty talking, could not put her thoughts together, this has triggered a panic attack, lasting for 30 minutes, rapid heart racing, chest tightness, she went to the emergency room, an hour later, she got a severe right frontal, retro-orbital area pounding headache, with associated light noise sensitivity, lasting for 3-4 hours, she was getting Ativan, ibuprofen, symptoms resolved.  Over the past few weeks, she had more headaches, sometimes preceding by gold lining in her left visual field, flashing lights, she also has frequent panic attacks,  She has a history of migraine since teens, usually clustered around her menstruation period of time, she described difficulty sleeping, anxiety.   REVIEW OF SYSTEMS: Full 14 system review of systems performed and notable only for fatigue, chest pain, palpitation, murmur, birthmarks, itching, moles, blurred vision, snoring, bloody urine, feeling hot, increased thirst, flushing, cramps, confusion, headaches, numbness, weakness, slurred speech, dizziness, insomnia, sleepiness, snoring, restless leg, anxiety, not enough sleep, decreased energy, racing thoughts,  ALLERGIES: Allergies  Allergen Reactions  . Percocet (Oxycodone-Acetaminophen)   . Shellfish Allergy   . Amoxicillin-Pot Clavulanate Other (See Comments)    Heart race, blood pressure up. Lethargic  . Doxycycline Nausea And Vomiting  . Hydrocodone Itching  . Prednisone  Nausea And Vomiting    HOME MEDICATIONS: Outpatient Prescriptions Prior to Visit  Medication Sig Dispense Refill  . aspirin 81 MG tablet Take 81 mg by mouth as needed for pain (chest pain).      . calcium carbonate (TUMS - DOSED IN MG ELEMENTAL CALCIUM) 500 MG chewable tablet Chew 1 tablet by mouth as needed for heartburn.      Marland Kitchen ibuprofen (ADVIL,MOTRIN) 600 MG tablet Take 1 tablet (600 mg total) by mouth every 6 (six) hours as needed for pain.  30 tablet  0  . LORazepam (ATIVAN) 1 MG tablet Take 0.5 tablets (0.5 mg total) by mouth 3 (three) times daily as needed for anxiety.  6 tablet  0  . Prenatal Vit-Fe Fumarate-FA (PRENATAL MULTIVITAMIN) TABS Take 1 tablet by mouth daily at 12 noon.      . ranitidine (ZANTAC) 150 MG capsule Take 150 mg by mouth as needed for heartburn.       No facility-administered medications prior to visit.    PAST MEDICAL HISTORY: Past Medical History  Diagnosis Date  . Diabetes mellitus without complication     prediabetes  . PCOS (polycystic ovarian syndrome)   . Kidney stones   . Cyst of ovary   . Heart murmur   . Anxiety     PAST SURGICAL HISTORY: Past Surgical History  Procedure Laterality Date  . Gallbladder surgery  2010  . Artery repair Left 2010    lower extremity    FAMILY HISTORY: Family History  Problem Relation Age of Onset  . Hypertension Mother   . Congestive Heart Failure Father   . Breast cancer Maternal Grandmother   . Heart attack Maternal Grandfather   . Brain cancer Paternal Grandfather  SOCIAL HISTORY:  History   Social History  . Marital Status: Married    Spouse Name: N/A    Number of Children: 0  . Years of Education: dental deg   Occupational History  . dental assistant     [employer]   Social History Main Topics  . Smoking status: Former Games developer  . Smokeless tobacco: Not on file     Comment: quit 09/2008  . Alcohol Use: No  . Drug Use: No  . Sexually Active: Not on file   Other  Topics Concern  . Not on file   Social History Narrative  . No narrative on file     PHYSICAL EXAM    Filed Vitals:   12/14/12 1528  BP: 128/89  Pulse: 109  Height: 5\' 2"  (1.575 m)  Weight: 163 lb (73.936 kg)    Not recorded    Body mass index is 29.81 kg/(m^2).   Generalized: In no acute distress  Neck: Supple, no carotid bruits   Cardiac: Regular rate rhythm  Pulmonary: Clear to auscultation bilaterally  Musculoskeletal: No deformity  Neurological examination  Mentation: Alert oriented to time, place, history taking, and causual conversation  Cranial nerve II-XII: Pupils were equal round reactive to light extraocular movements were full, visual field were full on confrontational test. facial sensation and strength were normal. hearing was intact to finger rubbing bilaterally. Uvula tongue midline.  head turning and shoulder shrug and were normal and symmetric.Tongue protrusion into cheek strength was normal.  Motor: normal tone, bulk and strength.  Sensory: Intact to fine touch, pinprick, preserved vibratory sensation, and proprioception at toes.  Coordination: Normal finger to nose, heel-to-shin bilaterally there was no truncal ataxia  Gait: Rising up from seated position without assistance, normal stance, without trunk ataxia, moderate stride, good arm swing, smooth turning, able to perform tiptoe, and heel walking without difficulty.   Romberg signs: Negative  Deep tendon reflexes: Brachioradialis 2/2, biceps 2/2, triceps 2/2, patellar 2/2, Achilles 2/2, plantar responses were flexor bilaterally.   DIAGNOSTIC DATA (LABS, IMAGING, TESTING) - I reviewed patient records, labs, notes, testing and imaging myself where available.  Lab Results  Component Value Date   WBC 9.9 12/04/2012   HGB 14.6 12/04/2012   HCT 43.0 12/04/2012   MCV 88.5 12/04/2012   PLT 301 12/04/2012      Component Value Date/Time   NA 140 12/04/2012 1217   K 3.3* 12/04/2012 1217   CL 103  12/04/2012 1217   CO2 23 12/04/2012 1155   GLUCOSE 113* 12/04/2012 1217   BUN 7 12/04/2012 1217   CREATININE 0.90 12/04/2012 1217   CALCIUM 9.2 12/04/2012 1155   PROT 7.4 12/04/2012 1155   ALBUMIN 4.5 12/04/2012 1155   AST 20 12/04/2012 1155   ALT 24 12/04/2012 1155   ALKPHOS 79 12/04/2012 1155   BILITOT 1.2 12/04/2012 1155   GFRNONAA 90* 12/04/2012 1155   GFRAA >90 12/04/2012 1155     ASSESSMENT AND PLAN  32 years old Caucasian female, with past medical history of headaches, consistent with migraine, now has frequent panic attacks, and also complicated migraine, she has normal neurological examinations MRI of the brain  1 complicated migraine will start preventive medication nortriptyline 10 mg every night titrating to 20 mg every night 2. Imitrex as needed for abortive treatment  3. return to clinic in 3 months with Gerlene Fee, M.D. Ph.D.  [facility] [address], [address] [place], [place] [address] [phone]  [phone] 

## 2012-12-15 ENCOUNTER — Other Ambulatory Visit: Payer: Self-pay | Admitting: Neurology

## 2012-12-15 NOTE — Telephone Encounter (Signed)
Called patient left her a message Ativan RX has been called in for her. Call office back if any questions.

## 2012-12-19 ENCOUNTER — Telehealth: Payer: Self-pay | Admitting: Neurology

## 2012-12-19 NOTE — Telephone Encounter (Signed)
Called patient to let her know she can take Imitrex if Excedrin migraine does not work after 3 hours. No answer left voice mail asking the patient to return my call.

## 2012-12-21 ENCOUNTER — Telehealth: Payer: Self-pay | Admitting: Neurology

## 2012-12-21 LAB — THYROID PANEL WITH TSH: T3 Uptake Ratio: 29 % (ref 24–39)

## 2012-12-22 NOTE — Telephone Encounter (Signed)
Patient is calling for her lab results.  

## 2012-12-25 NOTE — Telephone Encounter (Signed)
Left message on patient's cell phone with normal thyroid function test.  Also called home number and spoke with husband about normal results.

## 2012-12-25 NOTE — Telephone Encounter (Signed)
I have called her, [phone], left message, thyroid functional test was normal.  [name], Please call her to let her know the lab result.

## 2013-03-30 ENCOUNTER — Ambulatory Visit: Payer: BC Managed Care – PPO | Admitting: Neurology

## 2013-04-22 ENCOUNTER — Encounter (HOSPITAL_COMMUNITY): Payer: Self-pay | Admitting: Emergency Medicine

## 2013-04-22 ENCOUNTER — Emergency Department (HOSPITAL_COMMUNITY)
Admission: EM | Admit: 2013-04-22 | Discharge: 2013-04-23 | Disposition: A | Discharge status: Home / Self Care | Payer: BC Managed Care – PPO | Attending: Emergency Medicine | Admitting: Emergency Medicine

## 2013-04-22 DIAGNOSIS — R197 Diarrhea, unspecified: Secondary | ICD-10-CM | POA: Insufficient documentation

## 2013-04-22 DIAGNOSIS — R011 Cardiac murmur, unspecified: Secondary | ICD-10-CM | POA: Insufficient documentation

## 2013-04-22 DIAGNOSIS — R1011 Right upper quadrant pain: Secondary | ICD-10-CM | POA: Insufficient documentation

## 2013-04-22 DIAGNOSIS — F411 Generalized anxiety disorder: Secondary | ICD-10-CM | POA: Insufficient documentation

## 2013-04-22 DIAGNOSIS — E119 Type 2 diabetes mellitus without complications: Secondary | ICD-10-CM | POA: Insufficient documentation

## 2013-04-22 DIAGNOSIS — Z8742 Personal history of other diseases of the female genital tract: Secondary | ICD-10-CM | POA: Insufficient documentation

## 2013-04-22 DIAGNOSIS — G43909 Migraine, unspecified, not intractable, without status migrainosus: Secondary | ICD-10-CM | POA: Insufficient documentation

## 2013-04-22 DIAGNOSIS — Z87891 Personal history of nicotine dependence: Secondary | ICD-10-CM | POA: Insufficient documentation

## 2013-04-22 DIAGNOSIS — M25519 Pain in unspecified shoulder: Secondary | ICD-10-CM | POA: Insufficient documentation

## 2013-04-22 DIAGNOSIS — Z3202 Encounter for pregnancy test, result negative: Secondary | ICD-10-CM | POA: Insufficient documentation

## 2013-04-22 DIAGNOSIS — R109 Unspecified abdominal pain: Secondary | ICD-10-CM | POA: Insufficient documentation

## 2013-04-22 DIAGNOSIS — Z87442 Personal history of urinary calculi: Secondary | ICD-10-CM | POA: Insufficient documentation

## 2013-04-22 DIAGNOSIS — R110 Nausea: Secondary | ICD-10-CM | POA: Insufficient documentation

## 2013-04-22 DIAGNOSIS — Z79899 Other long term (current) drug therapy: Secondary | ICD-10-CM | POA: Insufficient documentation

## 2013-04-22 HISTORY — DX: Migraine, unspecified, not intractable, without status migrainosus: G43.909

## 2013-04-22 NOTE — ED Provider Notes (Signed)
CSN: [id number]     Arrival date & time 04/22/13  2148 History   First MD Initiated Contact with Patient 04/22/13 2324     Chief Complaint  Patient presents with  . Abdominal Pain   (Consider location/radiation/quality/duration/timing/severity/associated sxs/prior Treatment) HPI This is a 32 year old female with a history of kidney stones, polycystic ovary disease and biliary disease status post cholecystectomy. She is here with 3-4 weeks of abdominal pain. The abdominal pain is intermittent. It is located sometimes in the right upper quadrant, sometimes in the right flank, sometimes in the right shoulder and sometimes in the lower abdomen. It is worse with movement. It is improved while lying still. It is associated with nausea but no vomiting. She has had some diarrhea. She has a history of chronic hematuria since birth. She was seen at another hospital a week ago and diagnosed with kidney stones although no imaging study was performed. She's been treating herself with ibuprofen and Flomax without relief. She states the pain sometimes feels like the pain she had prior to her cholecystectomy. She has not had a fever or chills.  Past Medical History  Diagnosis Date  . Diabetes mellitus without complication     prediabetes  . PCOS (polycystic ovarian syndrome)   . Kidney stones   . Cyst of ovary   . Heart murmur   . Anxiety   . Migraines    Past Surgical History  Procedure Laterality Date  . Gallbladder surgery  2010  . Artery repair Left 2010    lower extremity   Family History  Problem Relation Age of Onset  . Hypertension Mother   . Congestive Heart Failure Father   . Breast cancer Maternal Grandmother   . Heart attack Maternal Grandfather   . Brain cancer Paternal Grandfather    History  Substance Use Topics  . Smoking status: Former Games developer  . Smokeless tobacco: Not on file     Comment: quit 09/2008  . Alcohol Use: No   OB History   Grav Para Term Preterm Abortions TAB  SAB Ect Mult Living                 Review of Systems  All other systems reviewed and are negative.    Allergies  Shellfish allergy; Amoxicillin-pot clavulanate; Doxycycline; Hydrocodone; Prednisone; and Percocet  Home Medications   Current Outpatient Rx  Name  Route  Sig  Dispense  Refill  . aspirin-acetaminophen-caffeine (EXCEDRIN MIGRAINE) 250-250-65 MG per tablet   Oral   Take 1 tablet by mouth every 6 (six) hours as needed for headache.         . calcium carbonate (TUMS - DOSED IN MG ELEMENTAL CALCIUM) 500 MG chewable tablet   Oral   Chew 1 tablet by mouth as needed for heartburn.         Marland Kitchen ibuprofen (ADVIL,MOTRIN) 600 MG tablet   Oral   Take 1 tablet (600 mg total) by mouth every 6 (six) hours as needed for pain.   30 tablet   0   . LORazepam (ATIVAN) 1 MG tablet   Oral   Take 0.5 tablets (0.5 mg total) by mouth 3 (three) times daily as needed for anxiety.   45 tablet   1   . omeprazole (PRILOSEC) 20 MG capsule   Oral   Take 20 mg by mouth every morning.         . SUMAtriptan (IMITREX) 100 MG tablet   Oral   Take 1 tablet (100  mg total) by mouth as needed for migraine.   15 tablet   6   . tamsulosin (FLOMAX) 0.4 MG CAPS capsule   Oral   Take 0.4 mg by mouth daily. For possible kidney stone          BP 135/94  Pulse 111  Temp(Src) 98.4 F (36.9 C) (Oral)  Resp 20  SpO2 98%  LMP 03/29/2013  Physical Exam General: Well-developed, well-nourished female in no acute distress; appearance consistent with age of record HENT: normocephalic; atraumatic Eyes: pupils equal, round and reactive to light; extraocular muscles intact Neck: supple Heart: regular rate and rhythm Lungs: clear to auscultation bilaterally Abdomen: soft; nondistended; very slight right upper quadrant tenderness; no masses or hepatosplenomegaly; bowel sounds present GU: Very slight right CVA tenderness Extremities: No deformity; full range of motion; pulses normal Neurologic:  Awake, alert and oriented; motor function intact in all extremities and symmetric; no facial droop Skin: Warm and dry Psychiatric: Normal mood and affect    ED Course  Procedures (including critical care time)    MDM  Nursing notes and vitals signs, including pulse oximetry, reviewed.  Summary of this visit's results, reviewed by myself:  Labs:  Results for orders placed during the hospital encounter of 04/22/13 (from the past 24 hour(s))  CBC WITH DIFFERENTIAL     Status: Abnormal   Collection Time    04/22/13 10:55 PM      Result Value Range   WBC 10.2  4.0 - 10.5 K/uL   RBC 5.10  3.87 - 5.11 MIL/uL   Hemoglobin 15.9 (*) 12.0 - 15.0 g/dL   HCT 30.8  65.7 - 84.6 %   MCV 88.0  78.0 - 100.0 fL   MCH 31.2  26.0 - 34.0 pg   MCHC 35.4  30.0 - 36.0 g/dL   RDW 96.2  95.2 - 84.1 %   Platelets 310  150 - 400 K/uL   Neutrophils Relative % 63  43 - 77 %   Neutro Abs 6.4  1.7 - 7.7 K/uL   Lymphocytes Relative 29  12 - 46 %   Lymphs Abs 3.0  0.7 - 4.0 K/uL   Monocytes Relative 7  3 - 12 %   Monocytes Absolute 0.7  0.1 - 1.0 K/uL   Eosinophils Relative 1  0 - 5 %   Eosinophils Absolute 0.1  0.0 - 0.7 K/uL   Basophils Relative 0  0 - 1 %   Basophils Absolute 0.0  0.0 - 0.1 K/uL  BASIC METABOLIC PANEL     Status: None   Collection Time    04/22/13 10:55 PM      Result Value Range   Sodium 137  135 - 145 mEq/L   Potassium 3.5  3.5 - 5.1 mEq/L   Chloride 100  96 - 112 mEq/L   CO2 22  19 - 32 mEq/L   Glucose, Bld 96  70 - 99 mg/dL   BUN 9  6 - 23 mg/dL   Creatinine, Ser 3.24  0.50 - 1.10 mg/dL   Calcium 40.1  8.4 - 02.7 mg/dL   GFR calc non Af Amer >90  >90 mL/min   GFR calc Af Amer >90  >90 mL/min  HEPATIC FUNCTION PANEL     Status: None   Collection Time    04/22/13 10:55 PM      Result Value Range   Total Protein 8.3  6.0 - 8.3 g/dL   Albumin 4.9  3.5 - 5.2  g/dL   AST 20  0 - 37 U/L   ALT 25  0 - 35 U/L   Alkaline Phosphatase 76  39 - 117 U/L   Total Bilirubin 0.9   0.3 - 1.2 mg/dL   Bilirubin, Direct <8.1  0.0 - 0.3 mg/dL   Indirect Bilirubin NOT CALCULATED  0.3 - 0.9 mg/dL  LIPASE, BLOOD     Status: Abnormal   Collection Time    04/22/13 10:55 PM      Result Value Range   Lipase 63 (*) 11 - 59 U/L  URINALYSIS, ROUTINE W REFLEX MICROSCOPIC     Status: Abnormal   Collection Time    04/22/13 11:07 PM      Result Value Range   Color, Urine YELLOW  YELLOW   APPearance CLEAR  CLEAR   Specific Gravity, Urine 1.022  1.005 - 1.030   pH 5.5  5.0 - 8.0   Glucose, UA NEGATIVE  NEGATIVE mg/dL   Hgb urine dipstick MODERATE (*) NEGATIVE   Bilirubin Urine NEGATIVE  NEGATIVE   Ketones, ur NEGATIVE  NEGATIVE mg/dL   Protein, ur NEGATIVE  NEGATIVE mg/dL   Urobilinogen, UA 0.2  0.0 - 1.0 mg/dL   Nitrite NEGATIVE  NEGATIVE   Leukocytes, UA NEGATIVE  NEGATIVE  PREGNANCY, URINE     Status: None   Collection Time    04/22/13 11:07 PM      Result Value Range   Preg Test, Ur NEGATIVE  NEGATIVE  URINE MICROSCOPIC-ADD ON     Status: Abnormal   Collection Time    04/22/13 11:07 PM      Result Value Range   Squamous Epithelial / LPF FEW (*) RARE   WBC, UA 0-2  <3 WBC/hpf   RBC / HPF 3-6  <3 RBC/hpf   Bacteria, UA FEW (*) RARE    Imaging Studies: Ct Abdomen Pelvis Wo Contrast  04/23/2013   CLINICAL DATA:  Flank pain.  EXAM: CT ABDOMEN AND PELVIS WITHOUT CONTRAST  TECHNIQUE: Multidetector CT imaging of the abdomen and pelvis was performed following the standard protocol without intravenous contrast.  COMPARISON:  07/31/2012  FINDINGS: BODY WALL: Unremarkable.  LOWER CHEST: Unremarkable.  ABDOMEN/PELVIS:  Liver: No focal abnormality.  Biliary: Cholecystectomy.  Pancreas: Unremarkable.  Spleen: Unremarkable.  Adrenals: Unremarkable.  Kidneys and ureters: No hydronephrosis or stone.  Bladder: Unremarkable.  Reproductive: Symmetrically sized ovaries. Interval decreased size or resolution of a right ovarian cyst.  Bowel: 7 mm fatty density structure near the rectosigmoid  junction is unchanged from priors and favored to represent a previously torsed epiploic appendage (as opposed to a fatty right ovarian mass). Normal appendix.  Retroperitoneum: No mass or adenopathy.  Peritoneum: No free fluid or gas.  Vascular: No acute abnormality.  OSSEOUS: No acute abnormalities.  IMPRESSION: 1. Negative for hydronephrosis or nephrolithiasis. 2. Normal appendix.   Electronically Signed   By: Tiburcio Pea M.D.   On: 04/23/2013 01:52   2:01 AM Patient was advised of her CT and lab findings. She will followup with her primary care physician.     Hanley Seamen, MD 04/23/13 [phone]

## 2013-04-22 NOTE — ED Notes (Signed)
Pt states seen at [facility] last Monday and was dx with kidney stones.  No scan done.  Pt sent home with flomax and percocet.  Pt states she has had rt upper and lower abdominal pain x 3/4 weeks.  Pt states pain continues.  Slight diarrhea but pt states this is normal since cholecystectomy.  No fever.

## 2013-04-23 ENCOUNTER — Emergency Department (HOSPITAL_COMMUNITY): Payer: BC Managed Care – PPO

## 2013-04-23 LAB — CBC WITH DIFFERENTIAL/PLATELET
Basophils Absolute: 0 10*3/uL (ref 0.0–0.1)
HCT: 44.9 % (ref 36.0–46.0)
Lymphocytes Relative: 29 % (ref 12–46)
Lymphs Abs: 3 10*3/uL (ref 0.7–4.0)
Neutro Abs: 6.4 10*3/uL (ref 1.7–7.7)
Platelets: 310 10*3/uL (ref 150–400)
RBC: 5.1 MIL/uL (ref 3.87–5.11)
RDW: 12.2 % (ref 11.5–15.5)
WBC: 10.2 10*3/uL (ref 4.0–10.5)

## 2013-04-23 LAB — URINALYSIS, ROUTINE W REFLEX MICROSCOPIC
Glucose, UA: NEGATIVE mg/dL
Leukocytes, UA: NEGATIVE
Protein, ur: NEGATIVE mg/dL
Specific Gravity, Urine: 1.022 (ref 1.005–1.030)
Urobilinogen, UA: 0.2 mg/dL (ref 0.0–1.0)

## 2013-04-23 LAB — HEPATIC FUNCTION PANEL
ALT: 25 U/L (ref 0–35)
AST: 20 U/L (ref 0–37)
Albumin: 4.9 g/dL (ref 3.5–5.2)
Alkaline Phosphatase: 76 U/L (ref 39–117)
Bilirubin, Direct: <0.1 mg/dL (ref 0.0–0.3)

## 2013-04-23 LAB — BASIC METABOLIC PANEL
CO2: 22 mEq/L (ref 19–32)
Chloride: 100 mEq/L (ref 96–112)
Glucose, Bld: 96 mg/dL (ref 70–99)
Sodium: 137 mEq/L (ref 135–145)

## 2013-04-23 LAB — URINE MICROSCOPIC-ADD ON

## 2013-04-23 LAB — LIPASE, BLOOD: Lipase: 63 U/L — ABNORMAL HIGH (ref 11–59)

## 2013-04-23 LAB — PREGNANCY, URINE: Preg Test, Ur: NEGATIVE

## 2013-04-23 MED ORDER — FENTANYL CITRATE 0.05 MG/ML IJ SOLN
50.0000 ug | Freq: Once | INTRAMUSCULAR | Status: AC
  Administered 2013-04-23: 50 ug via INTRAVENOUS
  Filled 2013-04-23: qty 2

## 2013-04-23 MED ORDER — SODIUM CHLORIDE 0.9 % IV BOLUS (SEPSIS)
1000.0000 mL | Freq: Once | INTRAVENOUS | Status: AC
  Administered 2013-04-23: 1000 mL via INTRAVENOUS

## 2013-12-11 ENCOUNTER — Emergency Department (HOSPITAL_COMMUNITY)
Admission: EM | Admit: 2013-12-11 | Discharge: 2013-12-11 | Disposition: A | Discharge status: Home / Self Care | Payer: BC Managed Care – PPO | Attending: Emergency Medicine | Admitting: Emergency Medicine

## 2013-12-11 ENCOUNTER — Encounter (HOSPITAL_COMMUNITY): Payer: Self-pay | Admitting: Emergency Medicine

## 2013-12-11 ENCOUNTER — Emergency Department (HOSPITAL_COMMUNITY): Payer: BC Managed Care – PPO

## 2013-12-11 DIAGNOSIS — Z87442 Personal history of urinary calculi: Secondary | ICD-10-CM | POA: Insufficient documentation

## 2013-12-11 DIAGNOSIS — Z8742 Personal history of other diseases of the female genital tract: Secondary | ICD-10-CM | POA: Insufficient documentation

## 2013-12-11 DIAGNOSIS — Z7982 Long term (current) use of aspirin: Secondary | ICD-10-CM | POA: Insufficient documentation

## 2013-12-11 DIAGNOSIS — Z79899 Other long term (current) drug therapy: Secondary | ICD-10-CM | POA: Insufficient documentation

## 2013-12-11 DIAGNOSIS — R011 Cardiac murmur, unspecified: Secondary | ICD-10-CM | POA: Insufficient documentation

## 2013-12-11 DIAGNOSIS — R079 Chest pain, unspecified: Secondary | ICD-10-CM

## 2013-12-11 DIAGNOSIS — G43909 Migraine, unspecified, not intractable, without status migrainosus: Secondary | ICD-10-CM | POA: Insufficient documentation

## 2013-12-11 DIAGNOSIS — Z880 Allergy status to penicillin: Secondary | ICD-10-CM | POA: Insufficient documentation

## 2013-12-11 DIAGNOSIS — Z87891 Personal history of nicotine dependence: Secondary | ICD-10-CM | POA: Insufficient documentation

## 2013-12-11 DIAGNOSIS — Z3202 Encounter for pregnancy test, result negative: Secondary | ICD-10-CM | POA: Insufficient documentation

## 2013-12-11 DIAGNOSIS — F411 Generalized anxiety disorder: Secondary | ICD-10-CM | POA: Insufficient documentation

## 2013-12-11 DIAGNOSIS — E119 Type 2 diabetes mellitus without complications: Secondary | ICD-10-CM | POA: Insufficient documentation

## 2013-12-11 LAB — POC URINE PREG, ED: Preg Test, Ur: NEGATIVE

## 2013-12-11 LAB — BASIC METABOLIC PANEL
Anion gap: 15 (ref 5–15)
BUN: 11 mg/dL (ref 6–23)
CO2: 23 meq/L (ref 19–32)
CREATININE: 0.82 mg/dL (ref 0.50–1.10)
Calcium: 9.6 mg/dL (ref 8.4–10.5)
Chloride: 100 mEq/L (ref 96–112)
GFR calc Af Amer: >90 mL/min (ref >90)
GFR calc non Af Amer: >90 mL/min (ref >90)
Glucose, Bld: 160 mg/dL — ABNORMAL HIGH (ref 70–99)
Potassium: 4.1 mEq/L (ref 3.7–5.3)
Sodium: 138 mEq/L (ref 137–147)

## 2013-12-11 LAB — CBC
HCT: 42.6 % (ref 36.0–46.0)
HEMOGLOBIN: 14.8 g/dL (ref 12.0–15.0)
MCH: 31 pg (ref 26.0–34.0)
MCHC: 34.7 g/dL (ref 30.0–36.0)
MCV: 89.1 fL (ref 78.0–100.0)
Platelets: 320 10*3/uL (ref 150–400)
RBC: 4.78 MIL/uL (ref 3.87–5.11)
RDW: 12.2 % (ref 11.5–15.5)
WBC: 9.8 10*3/uL (ref 4.0–10.5)

## 2013-12-11 LAB — I-STAT TROPONIN, ED: Troponin i, poc: 0.01 ng/mL (ref 0.00–0.08)

## 2013-12-11 MED ORDER — HYDROXYZINE HCL 10 MG PO TABS: 10.0000 mg | ORAL_TABLET | Freq: Four times a day (QID) | ORAL | Status: AC | PRN

## 2013-12-11 NOTE — ED Notes (Signed)
Family at bedside. 

## 2013-12-11 NOTE — ED Notes (Signed)
Patient is without pain at this time but rates it at 4 when it comes. It comes and goes.

## 2013-12-11 NOTE — ED Notes (Signed)
Pt states that twice today she felt like her left chest was squeezing, pt states she has anxiety and took ativan and that seems to calm her down, pt also took 2- 81 mg aspirin.

## 2013-12-11 NOTE — Discharge Instructions (Signed)
Take the prescribed medication as directed if you no longer wish to take the ativan.  Do not take both medications at the same time! Follow-up with your primary care physician. Return to the ED for new or worsening symptoms.

## 2013-12-11 NOTE — ED Provider Notes (Signed)
Medical screening examination/treatment/procedure(s) were performed by non-physician practitioner and as supervising physician I was immediately available for consultation/collaboration.   EKG Interpretation   Date/Time:  Tuesday December 11 2013 15:32:13 EDT Ventricular Rate:  96 PR Interval:  136 QRS Duration: 81 QT Interval:  360 QTC Calculation: 455 R Axis:   47 Text Interpretation:  Sinus rhythm Low voltage, precordial leads No  significant change since last tracing Confirmed by Jebediah Macrae  MD, Melchor Kirchgessner ([id number])  on 12/11/2013 3:54:51 PM        Richardean Canalavid H Aliviyah Malanga, MD 12/11/13 2352

## 2013-12-11 NOTE — ED Provider Notes (Signed)
CSN: [id number]     Arrival date & time 12/11/13  1526 History   First MD Initiated Contact with Patient 12/11/13 1543     Chief Complaint  Patient presents with  . Chest Pain     (Consider location/radiation/quality/duration/timing/severity/associated sxs/prior Treatment) The history is provided by the patient and medical records.   This is a 33 y.o. F with PMH significant for PCOS, kidney stones, ovarian cysts, migraine headaches, anxiety, presenting to the ED for chest pain.  Pt states today she had 2 separate episodes of squeezing chest pain that lasted less than 5 seconds each. She denies any associated shortness of breath, palpitations, dizziness, weakness, or radiation of pain into the neck or arms.  Patient has history of her murmur as a child, no problems with this since she was 11. Patient is a former smoker, quit several years ago. She does have family history of heart attacks, her father had one at age 34 but he was a heavy smoker.  Patient does admit that she has been under a great deal of stress with her job, family illnesses, and attempting to conceive.  States she feels that she needs a better way to manage her stress and worries.  Took ativan PTA which helped.  Pt asx on arrival.  Past Medical History  Diagnosis Date  . Diabetes mellitus without complication     prediabetes  . PCOS (polycystic ovarian syndrome)   . Kidney stones   . Cyst of ovary   . Heart murmur   . Anxiety   . Migraines    Past Surgical History  Procedure Laterality Date  . Gallbladder surgery  2010  . Artery repair Left 2010    lower extremity   Family History  Problem Relation Age of Onset  . Hypertension Mother   . Congestive Heart Failure Father   . Breast cancer Maternal Grandmother   . Heart attack Maternal Grandfather   . Brain cancer Paternal Grandfather    History  Substance Use Topics  . Smoking status: Former Games developermoker  . Smokeless tobacco: Not on file     Comment: quit 09/2008  .  Alcohol Use: No   OB History   Grav Para Term Preterm Abortions TAB SAB Ect Mult Living                 Review of Systems  Cardiovascular: Positive for chest pain.  All other systems reviewed and are negative.     Allergies  Shellfish allergy; Amoxicillin-pot clavulanate; Doxycycline; Hydrocodone; Prednisone; and Percocet  Home Medications   Prior to Admission medications   Medication Sig Start Date End Date Taking? Authorizing Provider  aspirin EC 81 MG tablet Take 162 mg by mouth once.   Yes Historical Provider, MD  aspirin-acetaminophen-caffeine (EXCEDRIN MIGRAINE) [phone] MG per tablet Take 1 tablet by mouth every 6 (six) hours as needed for headache.   Yes Historical Provider, MD  calcium carbonate (TUMS - DOSED IN MG ELEMENTAL CALCIUM) 500 MG chewable tablet Chew 1 tablet by mouth 4 (four) times daily as needed for heartburn.    Yes Historical Provider, MD  LORazepam (ATIVAN) 1 MG tablet Take 0.5 tablets (0.5 mg total) by mouth 3 (three) times daily as needed for anxiety. 12/14/12  Yes Levert FeinsteinYijun Yan, MD  Prenatal Vit-Fe Fumarate-FA (PRENATAL MULTIVITAMIN) TABS tablet Take 1 tablet by mouth every other day.   Yes Historical Provider, MD   BP 141/91  Pulse 97  Temp(Src) 97.7 F (36.5 C) (Oral)  Resp  18  SpO2 100%  LMP 11/26/2013  Physical Exam  Nursing note and vitals reviewed. Constitutional: She is oriented to person, place, and time. She appears well-developed and well-nourished. No distress.  HENT:  Head: Normocephalic and atraumatic.  Mouth/Throat: Oropharynx is clear and moist.  Eyes: Conjunctivae and EOM are normal. Pupils are equal, round, and reactive to light.  Neck: Normal range of motion. Neck supple.  Cardiovascular: Normal rate, regular rhythm and normal heart sounds.   Pulmonary/Chest: Effort normal and breath sounds normal. No respiratory distress. She has no wheezes.  Abdominal: Soft. Bowel sounds are normal. There is no tenderness. There is no  guarding.  Musculoskeletal: Normal range of motion.  Neurological: She is alert and oriented to person, place, and time.  Skin: Skin is warm and dry. She is not diaphoretic.  Psychiatric: She has a normal mood and affect.    ED Course  Procedures (including critical care time) Labs Review Labs Reviewed  BASIC METABOLIC PANEL - Abnormal; Notable for the following:    Glucose, Bld 160 (*)    All other components within normal limits  CBC  I-STAT TROPOININ, ED  POC URINE PREG, ED    Imaging Review Dg Chest Port 1 View  12/11/2013   CLINICAL DATA:  Chest pain  EXAM: PORTABLE CHEST - 1 VIEW  COMPARISON:  PA and lateral chest of September 01, 2008  FINDINGS: The lungs are well-expanded and clear. The heart and mediastinal structures are normal. There is no pleural effusion. The bony thorax is unremarkable.  IMPRESSION: There is no acute cardiopulmonary abnormality.   Electronically Signed   By: David  [place]   On: 12/11/2013 15:55     EKG Interpretation   Date/Time:  Tuesday December 11 2013 15:32:13 EDT Ventricular Rate:  96 PR Interval:  136 QRS Duration: 81 QT Interval:  360 QTC Calculation: 455 R Axis:   47 Text Interpretation:  Sinus rhythm Low voltage, precordial leads No  significant change since last tracing Confirmed by YAO  MD, DAVID ([id number])  on 12/11/2013 3:54:51 PM      MDM   Final diagnoses:  Chest pain, unspecified chest pain type   33 year old female with 2 episodes of squeezing chest pain earlier today, lasting less than 5 seconds each.  History of childhood heart murmur, no known cardiac issues. Patient is not a current smoker. Sx improved with ativan. EKG was obtained in triage, which is sinus rhythm without ischemic changes. Labs and chest x-ray pending at this time.  Labs are reassuring. Troponin is negative. Chest x-ray is clear.  Patient has remained asymptomatic with stable VS in the ED.  The patient has no cardiac risk factors, heart score of 0.  At this time I  have low suspicion for ACF, PE, dissection, or other acute cardiac event. Symptoms likely related to her stress and anxiety. Patient mentions that her Ativan tends to make her somewhat drowsy, and she would like to try different medication that is not a benzodiazepine.  Will write for atarax if needed.  FU with PCP.  Discussed plan with patient, he/she acknowledged understanding and agreed with plan of care.  Return precautions given for new or worsening symptoms.  Garlon HatchetLisa M Maebry Obrien, PA-C 12/11/13 2007

## 2014-11-29 ENCOUNTER — Other Ambulatory Visit: Payer: Self-pay | Admitting: Obstetrics and Gynecology

## 2014-12-02 LAB — CYTOLOGY - PAP

## 2015-01-30 ENCOUNTER — Other Ambulatory Visit: Payer: Self-pay | Admitting: Physician Assistant

## 2015-01-30 ENCOUNTER — Ambulatory Visit
Admission: RE | Admit: 2015-01-30 | Discharge: 2015-01-30 | Disposition: A | Discharge status: Home / Self Care | Payer: BLUE CROSS/BLUE SHIELD | Source: Ambulatory Visit | Attending: Physician Assistant | Admitting: Physician Assistant

## 2015-01-30 DIAGNOSIS — R110 Nausea: Secondary | ICD-10-CM

## 2015-01-30 DIAGNOSIS — R1011 Right upper quadrant pain: Secondary | ICD-10-CM

## 2015-01-30 DIAGNOSIS — R17 Unspecified jaundice: Secondary | ICD-10-CM

## 2015-01-30 DIAGNOSIS — R079 Chest pain, unspecified: Secondary | ICD-10-CM

## 2015-01-30 DIAGNOSIS — R0781 Pleurodynia: Secondary | ICD-10-CM

## 2015-01-31 ENCOUNTER — Other Ambulatory Visit: Payer: Self-pay

## 2017-04-04 ENCOUNTER — Other Ambulatory Visit: Payer: Self-pay | Admitting: Obstetrics and Gynecology

## 2017-04-04 DIAGNOSIS — R928 Other abnormal and inconclusive findings on diagnostic imaging of breast: Secondary | ICD-10-CM

## 2017-04-06 ENCOUNTER — Ambulatory Visit
Admission: RE | Admit: 2017-04-06 | Discharge: 2017-04-06 | Disposition: A | Discharge status: Home / Self Care | Payer: BLUE CROSS/BLUE SHIELD | Source: Ambulatory Visit | Attending: Obstetrics and Gynecology | Admitting: Obstetrics and Gynecology

## 2017-04-06 ENCOUNTER — Other Ambulatory Visit: Payer: Self-pay | Admitting: Obstetrics and Gynecology

## 2017-04-06 DIAGNOSIS — R928 Other abnormal and inconclusive findings on diagnostic imaging of breast: Secondary | ICD-10-CM

## 2017-04-08 ENCOUNTER — Other Ambulatory Visit: Payer: BLUE CROSS/BLUE SHIELD

## 2017-10-07 ENCOUNTER — Ambulatory Visit
Admission: RE | Admit: 2017-10-07 | Discharge: 2017-10-07 | Disposition: A | Discharge status: Home / Self Care | Payer: BLUE CROSS/BLUE SHIELD | Source: Ambulatory Visit | Attending: Obstetrics and Gynecology | Admitting: Obstetrics and Gynecology

## 2017-10-07 ENCOUNTER — Other Ambulatory Visit: Payer: Self-pay | Admitting: Obstetrics and Gynecology

## 2017-10-07 DIAGNOSIS — N6320 Unspecified lump in the left breast, unspecified quadrant: Secondary | ICD-10-CM

## 2017-10-07 DIAGNOSIS — R928 Other abnormal and inconclusive findings on diagnostic imaging of breast: Secondary | ICD-10-CM

## 2017-10-07 DIAGNOSIS — N6310 Unspecified lump in the right breast, unspecified quadrant: Secondary | ICD-10-CM

## 2018-04-13 ENCOUNTER — Other Ambulatory Visit: Payer: BLUE CROSS/BLUE SHIELD

## 2018-04-14 ENCOUNTER — Ambulatory Visit
Admission: RE | Admit: 2018-04-14 | Discharge: 2018-04-14 | Disposition: A | Discharge status: Home / Self Care | Payer: BLUE CROSS/BLUE SHIELD | Source: Ambulatory Visit | Attending: Obstetrics and Gynecology | Admitting: Obstetrics and Gynecology

## 2018-04-14 DIAGNOSIS — N6310 Unspecified lump in the right breast, unspecified quadrant: Secondary | ICD-10-CM

## 2018-04-14 DIAGNOSIS — N6320 Unspecified lump in the left breast, unspecified quadrant: Secondary | ICD-10-CM

## 2019-03-01 ENCOUNTER — Other Ambulatory Visit: Payer: Self-pay | Admitting: Obstetrics and Gynecology

## 2019-03-01 DIAGNOSIS — N63 Unspecified lump in unspecified breast: Secondary | ICD-10-CM

## 2019-04-16 ENCOUNTER — Ambulatory Visit
Admission: RE | Admit: 2019-04-16 | Discharge: 2019-04-16 | Disposition: A | Discharge status: Home / Self Care | Payer: BLUE CROSS/BLUE SHIELD | Source: Ambulatory Visit | Attending: Obstetrics and Gynecology | Admitting: Obstetrics and Gynecology

## 2019-04-16 ENCOUNTER — Other Ambulatory Visit: Payer: Self-pay

## 2019-04-16 DIAGNOSIS — N63 Unspecified lump in unspecified breast: Secondary | ICD-10-CM

## 2020-02-14 ENCOUNTER — Other Ambulatory Visit (HOSPITAL_COMMUNITY): Payer: Self-pay | Admitting: Nurse Practitioner

## 2020-02-14 ENCOUNTER — Ambulatory Visit (HOSPITAL_COMMUNITY)
Admission: RE | Admit: 2020-02-14 | Discharge: 2020-02-14 | Disposition: A | Discharge status: Home / Self Care | Payer: BC Managed Care – PPO | Source: Ambulatory Visit | Attending: Pulmonary Disease | Admitting: Pulmonary Disease

## 2020-02-14 DIAGNOSIS — U071 COVID-19: Secondary | ICD-10-CM | POA: Diagnosis present

## 2020-02-14 MED ORDER — SODIUM CHLORIDE 0.9 % IV SOLN
1200.0000 mg | Freq: Once | INTRAVENOUS | Status: AC
  Administered 2020-02-14: 1200 mg via INTRAVENOUS
  Filled 2020-02-14: qty 10

## 2020-02-14 MED ORDER — EPINEPHRINE 0.3 MG/0.3ML IJ SOAJ: 0.3000 mg | Freq: Once | INTRAMUSCULAR | Status: DC | PRN

## 2020-02-14 MED ORDER — ALBUTEROL SULFATE HFA 108 (90 BASE) MCG/ACT IN AERS: 2.0000 | INHALATION_SPRAY | Freq: Once | RESPIRATORY_TRACT | Status: DC | PRN

## 2020-02-14 MED ORDER — FAMOTIDINE IN NACL 20-0.9 MG/50ML-% IV SOLN: 20.0000 mg | Freq: Once | INTRAVENOUS | Status: DC | PRN

## 2020-02-14 MED ORDER — SODIUM CHLORIDE 0.9 % IV SOLN: INTRAVENOUS | Status: DC | PRN

## 2020-02-14 MED ORDER — METHYLPREDNISOLONE SODIUM SUCC 125 MG IJ SOLR: 125.0000 mg | Freq: Once | INTRAMUSCULAR | Status: DC | PRN

## 2020-02-14 MED ORDER — DIPHENHYDRAMINE HCL 50 MG/ML IJ SOLN: 50.0000 mg | Freq: Once | INTRAMUSCULAR | Status: DC | PRN

## 2020-02-14 NOTE — Progress Notes (Signed)
I connected by phone with Amber Wise on 02/14/2020 at 9:40 AM to discuss the potential use of a new treatment for mild to moderate COVID-19 viral infection in non-hospitalized patients.  This patient is a 39 y.o. female that meets the FDA criteria for Emergency Use Authorization of COVID monoclonal antibody casirivimab/imdevimab.  Has a (+) direct SARS-CoV-2 viral test result  Has mild or moderate COVID-19   Is NOT hospitalized due to COVID-19  Is within 10 days of symptom onset (02/10/20)  Has at least one of the high risk factor(s) for progression to severe COVID-19 and/or hospitalization as defined in EUA.  Specific high risk criteria : BMI > 25, Diabetes and Cardiovascular disease or hypertension   I have spoken and communicated the following to the patient or parent/caregiver regarding COVID monoclonal antibody treatment:  1. FDA has authorized the emergency use for the treatment of mild to moderate COVID-19 in adults and pediatric patients with positive results of direct SARS-CoV-2 viral testing who are 84 years of age and older weighing at least 40 kg, and who are at high risk for progressing to severe COVID-19 and/or hospitalization.  2. The significant known and potential risks and benefits of COVID monoclonal antibody, and the extent to which such potential risks and benefits are unknown.  3. Information on available alternative treatments and the risks and benefits of those alternatives, including clinical trials.  4. Patients treated with COVID monoclonal antibody should continue to self-isolate and use infection control measures (e.g., wear mask, isolate, social distance, avoid sharing personal items, clean and disinfect "high touch" surfaces, and frequent handwashing) according to CDC guidelines.   5. The patient or parent/caregiver has the option to accept or refuse COVID monoclonal antibody treatment.  After reviewing this information with the patient, The patient agreed to  proceed with receiving casirivimab\imdevimab infusion and will be provided a copy of the Fact sheet prior to receiving the infusion. [name] 02/14/2020 9:40 AM

## 2020-02-14 NOTE — Progress Notes (Signed)
  Diagnosis: COVID-19  Physician: Wright    Procedure: [facility] Med: casirivimab\imdevimab infusion - Provided patient with casirivimab\imdevimab fact sheet for patients, parents and caregivers prior to infusion.  Complications: No immediate complications noted.  Discharge: Discharged home   Andee Chivers S 02/14/2020   

## 2020-02-14 NOTE — Discharge Instructions (Signed)

## 2020-03-25 ENCOUNTER — Other Ambulatory Visit: Payer: Self-pay | Admitting: Obstetrics and Gynecology

## 2020-03-25 DIAGNOSIS — Z1231 Encounter for screening mammogram for malignant neoplasm of breast: Secondary | ICD-10-CM

## 2020-05-06 ENCOUNTER — Ambulatory Visit
Admission: RE | Admit: 2020-05-06 | Discharge: 2020-05-06 | Disposition: A | Discharge status: Home / Self Care | Payer: BC Managed Care – PPO | Source: Ambulatory Visit | Attending: Obstetrics and Gynecology | Admitting: Obstetrics and Gynecology

## 2020-05-06 ENCOUNTER — Other Ambulatory Visit: Payer: Self-pay

## 2020-05-06 ENCOUNTER — Other Ambulatory Visit: Payer: Self-pay | Admitting: Obstetrics and Gynecology

## 2020-05-06 DIAGNOSIS — Z1231 Encounter for screening mammogram for malignant neoplasm of breast: Secondary | ICD-10-CM

## 2020-05-06 DIAGNOSIS — R928 Other abnormal and inconclusive findings on diagnostic imaging of breast: Secondary | ICD-10-CM

## 2020-05-07 ENCOUNTER — Ambulatory Visit
Admission: RE | Admit: 2020-05-07 | Discharge: 2020-05-07 | Disposition: A | Discharge status: Home / Self Care | Payer: BC Managed Care – PPO | Source: Ambulatory Visit | Attending: Obstetrics and Gynecology | Admitting: Obstetrics and Gynecology

## 2020-05-07 ENCOUNTER — Ambulatory Visit: Payer: BC Managed Care – PPO

## 2020-05-07 DIAGNOSIS — R928 Other abnormal and inconclusive findings on diagnostic imaging of breast: Secondary | ICD-10-CM

## 2021-03-27 ENCOUNTER — Other Ambulatory Visit: Payer: Self-pay | Admitting: Obstetrics and Gynecology

## 2021-03-27 DIAGNOSIS — Z1231 Encounter for screening mammogram for malignant neoplasm of breast: Secondary | ICD-10-CM

## 2021-05-12 ENCOUNTER — Ambulatory Visit
Admission: RE | Admit: 2021-05-12 | Discharge: 2021-05-12 | Disposition: A | Discharge status: Home / Self Care | Payer: BC Managed Care – PPO | Source: Ambulatory Visit | Attending: Obstetrics and Gynecology | Admitting: Obstetrics and Gynecology

## 2021-05-12 DIAGNOSIS — Z1231 Encounter for screening mammogram for malignant neoplasm of breast: Secondary | ICD-10-CM

## 2022-03-29 ENCOUNTER — Other Ambulatory Visit: Payer: Self-pay | Admitting: Obstetrics and Gynecology

## 2022-03-29 DIAGNOSIS — Z1231 Encounter for screening mammogram for malignant neoplasm of breast: Secondary | ICD-10-CM

## 2022-07-06 ENCOUNTER — Ambulatory Visit
Admission: RE | Admit: 2022-07-06 | Discharge: 2022-07-06 | Disposition: A | Discharge status: Home / Self Care | Payer: BC Managed Care – PPO | Source: Ambulatory Visit | Attending: Obstetrics and Gynecology | Admitting: Obstetrics and Gynecology

## 2022-07-06 DIAGNOSIS — Z1231 Encounter for screening mammogram for malignant neoplasm of breast: Secondary | ICD-10-CM

## 2023-06-09 ENCOUNTER — Other Ambulatory Visit: Payer: Self-pay | Admitting: Obstetrics and Gynecology

## 2023-06-09 DIAGNOSIS — Z1231 Encounter for screening mammogram for malignant neoplasm of breast: Secondary | ICD-10-CM

## 2023-07-12 ENCOUNTER — Ambulatory Visit
Admission: RE | Admit: 2023-07-12 | Discharge: 2023-07-12 | Disposition: A | Discharge status: Home / Self Care | Payer: BC Managed Care – PPO | Source: Ambulatory Visit | Attending: Obstetrics and Gynecology | Admitting: Obstetrics and Gynecology

## 2023-07-12 DIAGNOSIS — Z1231 Encounter for screening mammogram for malignant neoplasm of breast: Secondary | ICD-10-CM

## 2024-06-11 ENCOUNTER — Other Ambulatory Visit: Payer: Self-pay | Admitting: Obstetrics and Gynecology

## 2024-06-11 DIAGNOSIS — Z1231 Encounter for screening mammogram for malignant neoplasm of breast: Secondary | ICD-10-CM

## 2024-07-16 ENCOUNTER — Ambulatory Visit
